# Patient Record
Sex: Female | Born: 1960 | Race: Black or African American | Hispanic: No | Marital: Single | Smoking: Never smoker
Health system: Southern US, Community
[De-identification: ages and names within clinical notes are randomized; demographics above are authoritative.]

## PROBLEM LIST (undated history)

## (undated) DIAGNOSIS — M1990 Unspecified osteoarthritis, unspecified site: Secondary | ICD-10-CM

## (undated) DIAGNOSIS — M719 Bursopathy, unspecified: Secondary | ICD-10-CM

---

## 2002-10-28 ENCOUNTER — Other Ambulatory Visit: Admission: RE | Admit: 2002-10-28 | Discharge: 2002-10-28 | Payer: Self-pay | Admitting: Family Medicine

## 2003-02-23 ENCOUNTER — Emergency Department (HOSPITAL_COMMUNITY): Admission: EM | Admit: 2003-02-23 | Discharge: 2003-02-23 | Payer: Self-pay | Admitting: Emergency Medicine

## 2007-07-18 ENCOUNTER — Emergency Department (HOSPITAL_COMMUNITY): Admission: EM | Admit: 2007-07-18 | Discharge: 2007-07-18 | Payer: Self-pay | Admitting: Emergency Medicine

## 2007-10-22 ENCOUNTER — Other Ambulatory Visit: Admission: RE | Admit: 2007-10-22 | Discharge: 2007-10-22 | Payer: Self-pay | Admitting: Family Medicine

## 2008-11-28 ENCOUNTER — Other Ambulatory Visit: Admission: RE | Admit: 2008-11-28 | Discharge: 2008-11-28 | Payer: Self-pay | Admitting: Internal Medicine

## 2015-07-30 ENCOUNTER — Encounter (HOSPITAL_BASED_OUTPATIENT_CLINIC_OR_DEPARTMENT_OTHER): Payer: Self-pay | Admitting: *Deleted

## 2015-07-30 ENCOUNTER — Emergency Department (HOSPITAL_BASED_OUTPATIENT_CLINIC_OR_DEPARTMENT_OTHER)
Admission: EM | Admit: 2015-07-30 | Discharge: 2015-07-30 | Disposition: A | Discharge status: Home / Self Care | Payer: PRIVATE HEALTH INSURANCE | Attending: Emergency Medicine | Admitting: Emergency Medicine

## 2015-07-30 DIAGNOSIS — M7551 Bursitis of right shoulder: Secondary | ICD-10-CM | POA: Insufficient documentation

## 2015-07-30 DIAGNOSIS — M79601 Pain in right arm: Secondary | ICD-10-CM | POA: Diagnosis present

## 2015-07-30 MED ORDER — PREDNISONE 20 MG PO TABS: ORAL_TABLET | ORAL | Status: DC

## 2015-07-30 MED ORDER — TRAMADOL HCL 50 MG PO TABS: 100.0000 mg | ORAL_TABLET | Freq: Four times a day (QID) | ORAL | Status: DC | PRN

## 2015-07-30 NOTE — ED Notes (Signed)
States rt arm is tender to touch, has difficulty lifting rt arm at times.

## 2015-07-30 NOTE — Discharge Instructions (Signed)
Bursitis °Bursitis is inflammation and irritation of a bursa, which is one of the small, fluid-filled sacs that cushion and protect the moving parts of your body. These sacs are located between bones and muscles, muscle attachments, or skin areas next to bones. A bursa protects these structures from the wear and tear that results from frequent movement. °An inflamed bursa causes pain and swelling. Fluid may build up inside the sac. Bursitis is most common near joints, especially the knees, elbows, hips, and shoulders. °CAUSES °Bursitis can be caused by:  °· Injury from: °¨ A direct blow, like falling on your knee or elbow. °¨ Overuse of a joint (repetitive stress). °· Infection. This can happen if bacteria gets into a bursa through a cut or scrape near a joint. °· Diseases that cause joint inflammation, such as gout and rheumatoid arthritis. °RISK FACTORS °You may be at risk for bursitis if you:  °· Have a job or hobby that involves a lot of repetitive stress on your joints. °· Have a condition that weakens your body's defense system (immune system), such as diabetes, cancer, or HIV. °· Lift and reach overhead often. °· Kneel or lean on hard surfaces often. °· Run or walk often. °SIGNS AND SYMPTOMS °The most common signs and symptoms of bursitis are: °· Pain that gets worse when you move the affected body part or put weight on it. °· Inflammation. °· Stiffness. °Other signs and symptoms may include: °· Redness. °· Tenderness. °· Warmth. °· Pain that continues after rest. °· Fever and chills. This may occur in bursitis caused by infection. °DIAGNOSIS °Bursitis may be diagnosed by:  °· Medical history and physical exam. °· MRI. °· A procedure to drain fluid from the bursa with a needle (aspiration). The fluid may be checked for signs of infection or gout. °· Blood tests to rule out other causes of inflammation. °TREATMENT  °Bursitis can usually be treated at home with rest, ice, compression, and elevation (RICE). For  mild bursitis, RICE treatment may be all you need. Other treatments may include: °· Nonsteroidal anti-inflammatory drugs (NSAIDs) to treat pain and inflammation. °· Corticosteroids to fight inflammation. You may have these drugs injected into and around the area of bursitis. °· Aspiration of bursitis fluid to relieve pain and improve movement. °· Antibiotic medicine to treat an infected bursa. °· A splint, brace, or walking aid. °· Physical therapy if you continue to have pain or limited movement. °· Surgery to remove a damaged or infected bursa. This may be needed if you have a very bad case of bursitis or if other treatments have not worked. °HOME CARE INSTRUCTIONS  °· Take medicines only as directed by your health care provider. °· If you were prescribed an antibiotic medicine, finish it all even if you start to feel better. °· Rest the affected area as directed by your health care provider. °¨ Keep the area elevated. °¨ Avoid activities that make pain worse. °· Apply ice to the injured area: °¨ Place ice in a plastic bag. °¨ Place a towel between your skin and the bag. °¨ Leave the ice on for 20 minutes, 2-3 times a day. °· Use splints, braces, pads, or walking aids as directed by your health care provider. °· Keep all follow-up visits as directed by your health care provider. This is important. °PREVENTION  °· Wear knee pads if you kneel often. °· Wear sturdy running or walking shoes that fit you well. °· Take regular breaks from repetitive activity. °· Warm   up by stretching before doing any strenuous activity. °· Maintain a healthy weight or lose weight as recommended by your health care provider. Ask your health care provider if you need help. °· Exercise regularly. Start any new physical activity gradually. °SEEK MEDICAL CARE IF:  °· Your bursitis is not responding to treatment or home care. °· You have a fever. °· You have chills. °  °This information is not intended to replace advice given to you by your  health care provider. Make sure you discuss any questions you have with your health care provider. °  °Document Released: 09/20/2000 Document Revised: 06/14/2015 Document Reviewed: 12/13/2013 °Elsevier Interactive Patient Education ©2016 [employer] ° °

## 2015-07-30 NOTE — ED Notes (Signed)
Verified HR via radial pulse, rate of 100/min, regular

## 2015-07-30 NOTE — ED Notes (Signed)
Pt presents with rt arm pain, onset this past Thursday, denies any falls or injuries to rt arm, pain is worse at night when trying to sleep

## 2015-07-30 NOTE — ED Provider Notes (Signed)
CSN: [id number]     Arrival date & time 07/30/15  8850 History   First MD Initiated Contact with Patient 07/30/15 0900     Chief Complaint  Patient presents with  . Arm Pain     (Consider location/radiation/quality/duration/timing/severity/associated sxs/prior Treatment) HPI Patient reports that she started developing right arm and shoulder pain 4 days ago. 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 She reports it continued to worsen however and now she has to use her other arm to raise her right arm up. She reports is too painful right on the front of the shoulder to even pick her arm up to the height of her shoulder. She reports she does not have numbness or tingling into the hand. She reports last night she really couldn't sleep because it was so painful. There has been no associated injury. Patient has history of rheumatoid arthritis. She states that she was on methotrexate up until 2 years ago. She has not had other associated symptoms. She otherwise feels well. History reviewed. No pertinent past medical history. History reviewed. No pertinent past surgical history. No family history on file. Social History  Substance Use Topics  . Smoking status: Never Smoker   . Smokeless tobacco: None  . Alcohol Use: No   OB History    No data available     Review of Systems  10 Systems reviewed and are negative for acute change except as noted in the HPI.   Allergies  Aspirin  Home Medications   Prior to Admission medications   Medication Sig Start Date End Date Taking? Authorizing Provider  predniSONE (DELTASONE) 20 MG tablet 3 tabs po day one, then 2 po daily x 4 days 07/30/15   Arby Barrette, MD  traMADol (ULTRAM) 50 MG tablet Take 2 tablets (100 mg total) by mouth every 6 (six) hours as needed. 07/30/15   Arby Barrette, MD   BP 125/83 mmHg  Pulse 110  Temp(Src) 98.3 F (36.8 C) (Oral)  Resp 20  Ht 5\' 2"  (1.575 m)  Wt 150  lb (68.04 kg)  BMI 27.43 kg/m2  SpO2 98% Physical Exam  Constitutional: She is oriented to person, place, and time. She appears well-developed and well-nourished. No distress.  HENT:  Head: Normocephalic and atraumatic.  Eyes: EOM are normal.  Neck: Neck supple.  No bony point tenderness of the cervical spine. No reproducible pain or muscular spasm along the trapezius.  Cardiovascular: Normal rate, regular rhythm, normal heart sounds and intact distal pulses.   Pulmonary/Chest: Effort normal and breath sounds normal. She exhibits no tenderness.  Abdominal: Soft. She exhibits no distension. There is no tenderness.  Musculoskeletal:  Reproducible point tenderness over the anterior point of the shoulder at the [facility] joint. Patient has significant pain with abduction and requires assistance for passive range of motion with abduction over the head. No visible swelling or erythema. Normal palpation of the axilla without lymph and not but the or tenderness. No edema or tenderness of soft tissues of the arm. No elbow effusion normal range of motion at the wrist and hand.  Neurological: She is alert and oriented to person, place, and time. No cranial nerve deficit. She exhibits normal muscle tone. Coordination normal.  Skin: Skin is warm and dry.  Psychiatric: She has a normal mood and affect.    ED Course  Procedures (including critical care time) Labs Review Labs Reviewed - No data to display  Imaging Review No  results found. I have personally reviewed and evaluated these images and lab results as part of my medical decision-making.   EKG Interpretation None      MDM   Final diagnoses:  Bursitis of deltoid, right   Findings consistent with bursitis or tendinitis over the shoulder. Point tenderness is consistent with acromioclavicular bursitis. She does not have compressive tenderness to the biceps or biceps weakness. Patient reports history consistent with rheumatoid arthritis with  history of methotrexate treatment. At this time her joint is not red or hot. Not have fever or other constitutional symptoms. Patient will be treated as on uncomplicated bursitis with recommended follow-up with her primary care doctor this week.    Arby Barrette, MD 07/30/15 [phone]

## 2015-07-30 NOTE — ED Notes (Signed)
In to see pt, pt in restroom

## 2015-07-30 NOTE — ED Notes (Signed)
Presents with rt arm pain.

## 2015-07-30 NOTE — ED Notes (Signed)
Rx's x 2 as written by EDP reviewed with pt, pt teaching done re: (1) importance of following instructions while taking Prednisone and (2) safety while taking pain medication, (3) when to follow up with MD or ED, opportunity for questions provided

## 2015-12-04 DIAGNOSIS — M8588 Other specified disorders of bone density and structure, other site: Secondary | ICD-10-CM | POA: Insufficient documentation

## 2015-12-04 DIAGNOSIS — G43909 Migraine, unspecified, not intractable, without status migrainosus: Secondary | ICD-10-CM | POA: Insufficient documentation

## 2015-12-04 DIAGNOSIS — M1990 Unspecified osteoarthritis, unspecified site: Secondary | ICD-10-CM | POA: Insufficient documentation

## 2016-01-05 DIAGNOSIS — E559 Vitamin D deficiency, unspecified: Secondary | ICD-10-CM | POA: Insufficient documentation

## 2016-01-05 DIAGNOSIS — M722 Plantar fascial fibromatosis: Secondary | ICD-10-CM | POA: Insufficient documentation

## 2016-01-05 DIAGNOSIS — J309 Allergic rhinitis, unspecified: Secondary | ICD-10-CM | POA: Insufficient documentation

## 2016-01-05 DIAGNOSIS — Z79899 Other long term (current) drug therapy: Secondary | ICD-10-CM | POA: Insufficient documentation

## 2016-03-02 ENCOUNTER — Emergency Department (HOSPITAL_BASED_OUTPATIENT_CLINIC_OR_DEPARTMENT_OTHER)
Admission: EM | Admit: 2016-03-02 | Discharge: 2016-03-02 | Disposition: A | Discharge status: Home / Self Care | Payer: PRIVATE HEALTH INSURANCE | Attending: Emergency Medicine | Admitting: Emergency Medicine

## 2016-03-02 ENCOUNTER — Encounter (HOSPITAL_BASED_OUTPATIENT_CLINIC_OR_DEPARTMENT_OTHER): Payer: Self-pay | Admitting: Emergency Medicine

## 2016-03-02 DIAGNOSIS — R05 Cough: Secondary | ICD-10-CM | POA: Diagnosis not present

## 2016-03-02 DIAGNOSIS — K1379 Other lesions of oral mucosa: Secondary | ICD-10-CM | POA: Diagnosis not present

## 2016-03-02 DIAGNOSIS — M25512 Pain in left shoulder: Secondary | ICD-10-CM | POA: Insufficient documentation

## 2016-03-02 HISTORY — DX: Bursopathy, unspecified: M71.9

## 2016-03-02 HISTORY — DX: Unspecified osteoarthritis, unspecified site: M19.90

## 2016-03-02 MED ORDER — PREDNISONE 20 MG PO TABS: ORAL_TABLET | ORAL | Status: DC

## 2016-03-02 NOTE — ED Provider Notes (Signed)
CSN: [id number]     Arrival date & time 03/02/16  1855 History   First MD Initiated Contact with Patient 03/02/16 1921     Chief Complaint  Patient presents with  . Oral Swelling     (Consider location/radiation/quality/duration/timing/severity/associated sxs/prior Treatment) HPI Comments: Patient with history of left shoulder bursitis presents with complaint of mouth pain starting this morning. Patient states that she has a sore throat and pain is worse with swallowing. She does not have any difficulty breathing. She denies dental pain, ear pain, fever. She states that her face feels swollen. No pain with movement of her neck. No treatments prior to arrival. She is able to eat without difficulty.   Patient states that her left shoulder pain is similar to her previous bursitis. She states that she was going to see her doctor on Tuesday and that oral steroids have provided her some relief in the past.  The history is provided by the patient.    Past Medical History  Diagnosis Date  . Arthritis   . Bursitis    History reviewed. No pertinent past surgical history. History reviewed. No pertinent family history. Social History  Substance Use Topics  . Smoking status: Never Smoker   . Smokeless tobacco: None  . Alcohol Use: No   OB History    No data available     Review of Systems  Constitutional: Negative for fever.  HENT: Positive for facial swelling and sore throat. Negative for dental problem, ear pain and trouble swallowing.   Respiratory: Positive for cough. Negative for shortness of breath and stridor.   Musculoskeletal: Positive for myalgias and arthralgias. Negative for neck pain.  Skin: Negative for color change.  Neurological: Negative for headaches.      Allergies  Aspirin  Home Medications   Prior to Admission medications   Medication Sig Start Date End Date Taking? Authorizing Provider  predniSONE (DELTASONE) 20 MG tablet 3 tabs po day one, then 2 po daily  x 4 days 07/30/15   Arby Barrette, MD  traMADol (ULTRAM) 50 MG tablet Take 2 tablets (100 mg total) by mouth every 6 (six) hours as needed. 07/30/15   Arby Barrette, MD   BP 118/89 mmHg  Pulse 87  Temp(Src) 98.7 F (37.1 C) (Oral)  Resp 18  Ht 5\' 2"  (1.575 m)  Wt 68.493 kg  BMI 27.61 kg/m2  SpO2 100% Physical Exam  Constitutional: She appears well-developed and well-nourished.  HENT:  Head: Normocephalic and atraumatic.  Right Ear: Tympanic membrane, external ear and ear canal normal.  Left Ear: Tympanic membrane, external ear and ear canal normal.  Nose: Nose normal.  Mouth/Throat: Uvula is midline, oropharynx is clear and moist and mucous membranes are normal. No trismus in the jaw. Abnormal dentition. Dental caries present. No dental abscesses or uvula swelling. No tonsillar abscesses.  No swelling or erythema noted on exam. Small tender nodule noted left sub-mental area.   Eyes: Conjunctivae are normal. Right eye exhibits no discharge. Left eye exhibits no discharge.  Neck: Normal range of motion. Neck supple.  No neck swelling or Ludwig's angina  Cardiovascular: Normal rate, regular rhythm and normal heart sounds.   Pulmonary/Chest: Effort normal and breath sounds normal.  Abdominal: Soft. There is no tenderness.  Musculoskeletal:       Left shoulder: She exhibits tenderness. She exhibits normal range of motion, no bony tenderness and no spasm.       Cervical back: Normal.       Left upper  arm: Normal.  Lymphadenopathy:    She has no cervical adenopathy.  Neurological: She is alert.  Skin: Skin is warm and dry.  Psychiatric: She has a normal mood and affect.  Nursing note and vitals reviewed.   ED Course  Procedures (including critical care time)   7:51 PM Patient seen and examined.   Vital signs reviewed and are as follows: BP 118/89 mmHg  Pulse 87  Temp(Src) 98.7 F (37.1 C) (Oral)  Resp 18  Ht 5\' 2"  (1.575 m)  Wt 68.493 kg  BMI 27.61 kg/m2  SpO2  100%  Plan: Anti-inflammatories, close monitoring for worsening swelling of her jaw or neck. Prescription given for prednisone.  MDM   Final diagnoses:  Mouth pain  Shoulder pain, acute, left   Mouth pain: Patient does have a small submental, likely lymph node, cannot entirely rule out swollen salivary gland. Throat exam is normal. No concerns for a deep space neck infection. Patient is able to breathe and swallow normally. No obvious dental abscess. No trismus. No indication for further imaging at this point.  Shoulder pain: Chronic, prednisone prescribed. Patient to follow-up with her doctor next week for further evaluation and management. Left upper extremity is neurovascularly intact.    , PA-C 03/02/16 2015  03/04/16, MD 03/02/16 2131

## 2016-03-02 NOTE — ED Notes (Signed)
Patient states that she has "normal" left shoulder pain and woke up this am with that pain. States that after she took a nap ealier today she has pain to her left throat and jaw.

## 2016-03-02 NOTE — ED Notes (Addendum)
Per pt report she has chronic lt shoulder pain an is planning on seeing her PCP for a steroid shot. She took a nap then woke to lt jaw pain and sore throat and tenderness at jaw. Pt reports that she feels like her lt face is swollen. Has not taken any medication for pain. No fever or chills , no difficulty eating.

## 2016-03-02 NOTE — Discharge Instructions (Signed)
Please read and follow all provided instructions.  Your diagnoses today include:  1. Mouth pain   2. Shoulder pain, acute, left    The exam and treatment you received today has been provided on an emergency basis only. This is not a substitute for complete medical or dental care.  Tests performed today include:  Vital signs. See below for your results today.   Medications prescribed:   Prednisone - steroid medicine   It is best to take this medication in the morning to prevent sleeping problems. If you are diabetic, monitor your blood sugar closely and stop taking Prednisone if blood sugar is over 300. Take with food to prevent stomach upset.   Take any prescribed medications only as directed.  Home care instructions:  Follow any educational materials contained in this packet.  Follow-up instructions: Please follow-up with your doctor for further evaluation of your symptoms.   Return instructions:   Please return to the Emergency Department if you experience worsening symptoms.  Please return if you develop a fever, you develop more swelling in your face or neck, you have trouble breathing or swallowing food.  Please return if you have any other emergent concerns.  Additional Information:  Your vital signs today were: BP 118/89 mmHg   Pulse 87   Temp(Src) 98.7 F (37.1 C) (Oral)   Resp 18   Ht 5\' 2"  (1.575 m)   Wt 68.493 kg   BMI 27.61 kg/m2   SpO2 100% If your blood pressure (BP) was elevated above 135/85 this visit, please have this repeated by your doctor within one month. --------------

## 2016-07-18 DIAGNOSIS — Z0000 Encounter for general adult medical examination without abnormal findings: Secondary | ICD-10-CM | POA: Insufficient documentation

## 2017-01-17 ENCOUNTER — Emergency Department (HOSPITAL_BASED_OUTPATIENT_CLINIC_OR_DEPARTMENT_OTHER): Payer: PRIVATE HEALTH INSURANCE

## 2017-01-17 ENCOUNTER — Encounter (HOSPITAL_BASED_OUTPATIENT_CLINIC_OR_DEPARTMENT_OTHER): Payer: Self-pay | Admitting: *Deleted

## 2017-01-17 ENCOUNTER — Emergency Department (HOSPITAL_BASED_OUTPATIENT_CLINIC_OR_DEPARTMENT_OTHER)
Admission: EM | Admit: 2017-01-17 | Discharge: 2017-01-17 | Disposition: A | Discharge status: Home / Self Care | Payer: PRIVATE HEALTH INSURANCE | Attending: Emergency Medicine | Admitting: Emergency Medicine

## 2017-01-17 DIAGNOSIS — M25552 Pain in left hip: Secondary | ICD-10-CM | POA: Insufficient documentation

## 2017-01-17 LAB — D-DIMER, QUANTITATIVE (NOT AT ARMC): D DIMER QUANT: 0.62 ug{FEU}/mL — AB (ref 0.00–0.50)

## 2017-01-17 MED ORDER — OXYCODONE-ACETAMINOPHEN 5-325 MG PO TABS: 1.0000 | ORAL_TABLET | ORAL | 0 refills | Status: DC | PRN

## 2017-01-17 MED ORDER — HYDROCODONE-ACETAMINOPHEN 5-325 MG PO TABS
1.0000 | ORAL_TABLET | Freq: Once | ORAL | Status: AC
  Administered 2017-01-17: 1 via ORAL
  Filled 2017-01-17: qty 1

## 2017-01-17 MED ORDER — ONDANSETRON 8 MG PO TBDP: 8.0000 mg | ORAL_TABLET | Freq: Three times a day (TID) | ORAL | 1 refills | Status: DC | PRN

## 2017-01-17 MED ORDER — NAPROXEN 250 MG PO TABS
500.0000 mg | ORAL_TABLET | Freq: Once | ORAL | Status: AC
  Administered 2017-01-17: 500 mg via ORAL
  Filled 2017-01-17: qty 2

## 2017-01-17 NOTE — ED Provider Notes (Addendum)
[facility] Provider Note: Debbie Dell, MD, FACEP  CSN: [id number] MRN: [id number] ARRIVAL: 01/17/17 at 0305 ROOM: [facility]   CHIEF COMPLAINT  Leg Pain   HISTORY OF PRESENT ILLNESS  Debbie Clayton is a 56 y.o. female with a history of rheumatoid arthritis who has had the gradual onset of pain in her mid left hip since yesterday. She feels the pain most severely in the midanterior groin fold. The pain has steadily worsened and is now severe. It is so severe she is unable to lift her left leg at the hip. She is not aware of any injury. She states it feels as if it is swollen but does not see or feel any swelling.  Consultation with the [facility] state controlled substances database reveals the patient has received a single prescription for codeine cough syrup and no other opioids in the past year.    Past Medical History:  Diagnosis Date  . Arthritis   . Bursitis     History reviewed. No pertinent surgical history.  History reviewed. No pertinent family history.  Social History  Substance Use Topics  . Smoking status: Never Smoker  . Smokeless tobacco: Never Used  . Alcohol use No    Prior to Admission medications   Medication Sig Start Date End Date Taking? Authorizing Provider  methotrexate (RHEUMATREX) 10 MG tablet Take 10 mg by mouth once a week. Caution: Chemotherapy. Protect from light.   Yes Historical Provider, MD  SUMAtriptan (IMITREX) 100 MG tablet Take 100 mg by mouth every 2 (two) hours as needed for migraine. May repeat in 2 hours if headache persists or recurs.   Yes Historical Provider, MD  oxyCODONE-acetaminophen (PERCOCET) 5-325 MG tablet Take 1 tablet by mouth every 4 (four) hours as needed (for pain). 01/17/17   Paula Libra, MD    Allergies Aspirin   REVIEW OF SYSTEMS  Negative except as noted here or in the History of Present Illness.   PHYSICAL EXAMINATION  Initial Vital Signs Blood pressure (!) 108/59, pulse 97,  temperature 97.9 F (36.6 C), resp. rate 16, height 5\' 5"  (1.651 m), weight 145 lb (65.8 kg), SpO2 100 %.  Examination General: Well-developed, well-nourished female in no acute distress; appearance consistent with age of record HENT: normocephalic; atraumatic Eyes: pupils equal, round and reactive to light; extraocular muscles intact Neck: supple Heart: regular rate and rhythm Lungs: clear to auscultation bilaterally Abdomen: soft; nondistended; nontender; bowel sounds present Extremities: No deformity; full range of motion except left hip; pulses normal; tenderness of the mid left anterior groin fold without palpable tendon defect, mass or swelling; very limited ability to anteroflex at the left hip; retroflexion of the left hip intact but limited due to pain; flexion and extension of the left knee intact Neurologic: Awake, alert and oriented; motor function intact in all extremities and symmetric; no facial droop Skin: Warm and dry Psychiatric: Normal mood and affect   RESULTS  Summary of this visit's results, reviewed by myself:   EKG Interpretation  Date/Time:    Ventricular Rate:    PR Interval:    QRS Duration:   QT Interval:    QTC Calculation:   R Axis:     Text Interpretation:        Laboratory Studies: Results for orders placed or performed during the hospital encounter of 01/17/17 (from the past 24 hour(s))  D-dimer, quantitative (not at [facility])     Status: Abnormal   Collection Time: 01/17/17  4:24 AM  Result Value Ref Range   D-Dimer, Quant 0.62 (H) 0.00 - 0.50 ug/mL-FEU   Imaging Studies: Ct Hip Left Wo Contrast  Result Date: 01/17/2017 CLINICAL DATA:  Acute onset of left inguinal and left hip pain. Unable to bear weight. Initial encounter. EXAM: CT OF THE LEFT HIP WITHOUT CONTRAST TECHNIQUE: Multidetector CT imaging of the left hip was performed according to the standard protocol. Multiplanar CT image reconstructions were also generated. COMPARISON:  Left hip  radiographs performed earlier today at 4:02 a.m. FINDINGS: Bones/Joint/Cartilage There is suspicion of a minimally displaced fracture extending across the medial aspect of the left femoral head; it is not well characterized laterally. The left femoral head remains seated at the acetabulum. Visualized joint spaces are preserved. A trace left hip joint effusion is noted. The cartilage is not well assessed on CT. Ligaments Suboptimally assessed by CT. Muscles and Tendons The visualized musculature is grossly unremarkable in appearance. No tendon abnormalities are characterized. Soft tissues The visualized portions of the bladder are grossly unremarkable. The visualized small large bowel are within normal limits. IMPRESSION: 1. Suspect minimally displaced fracture extending across the medial aspect of the left femoral head; it is not well characterized laterally. 2. Trace left hip joint effusion noted. Electronically Signed   By: Roanna Raider M.D.   On: 01/17/2017 05:57   Dg Hip Unilat W Or Wo Pelvis 2-3 Views Left  Result Date: 01/17/2017 CLINICAL DATA:  Acute onset of left inguinal pain. Unable to bear weight. Initial encounter. EXAM: DG HIP (WITH OR WITHOUT PELVIS) 2-3V LEFT COMPARISON:  None. FINDINGS: There is suggestion of a minimally displaced subcapital fracture of the left femoral neck, though this is difficult to fully characterize. Both femoral heads are seated normally within their respective acetabula. No significant degenerative change is appreciated. The sacroiliac joints are unremarkable in appearance. The visualized bowel gas pattern is grossly unremarkable in appearance. Scattered phleboliths are noted within the pelvis. IMPRESSION: Suggestion of minimally displaced subcapital fracture of the left femoral neck, difficult to fully characterize and seen only on a single view. This could be further assessed on MRI or CT, as deemed clinically appropriate. Electronically Signed   By: Roanna Raider  M.D.   On: 01/17/2017 04:29    ED COURSE  Nursing notes and initial vitals signs, including pulse oximetry, reviewed.  Vitals:   01/17/17 0319 01/17/17 0321  BP: (!) 108/59   Pulse: 97   Resp: 16   Temp: 97.9 F (36.6 C)   SpO2: 100%   Weight:  145 lb (65.8 kg)  Height:  5\' 5"  (1.651 m)   6:27 AM Discussed with Dr. of [facility]. He reviewed the patient's films. He is not convinced that this represents a true fracture at this point but believes we should treat it as such until proven otherwise. He advises that the location of this fracture, if present, would necessitate a hip replacement and not simple ORIF. He advises we provide the patient with crutches for necessary ambulation but she should otherwise stay at rest. He wishes to give the acute pain and inflammation about a week to subside and plans to obtain a follow-up MRI in a week if symptoms persist.  She has an appointment with her rheumatologist in 4 days area  PROCEDURES    ED DIAGNOSES     ICD-9-CM ICD-10-CM   1. Pain in left hip 719.45 M25.552        [facility], MD 01/17/17 [phone]    [id number],  MD 01/17/17 [phone]

## 2017-01-17 NOTE — ED Triage Notes (Signed)
Pt began having left upper leg/ groin pain x 13 hours ago progressively worsening to the point she is unable to bear weight.

## 2017-01-17 NOTE — Discharge Instructions (Signed)
Your x-rays suggest, but do not definitively prove, that you have a fracture (broken bone) in your left hip. You need to avoid bearing weight on your left leg and we will provide crutches in the meantime. You should try to rest as much as possible for the next week. Contact [facility] today to arrange an appointment; Dr. Aundria Rud would like to see you in about a week for reevaluation of your hip and possible MRI.

## 2018-04-22 IMAGING — CT CT HIP*L* W/O CM
2 of 4 series · 15 of 46 positions shown, 18 images · non-contrast
Comparison: Left hip radiographs performed earlier today at [DATE]
a.m.

CLINICAL DATA: Acute onset of left inguinal and left hip pain.
Unable to bear weight. Initial encounter.

EXAM:
CT OF THE LEFT HIP WITHOUT CONTRAST
TECHNIQUE: Multidetector CT imaging of the left hip was performed according to
the standard protocol. Multiplanar CT image reconstructions were
also generated.

[Series 7: axial soft tissue · axial · 0.32mm/px · z∈[+691,+841]mm · 12 of 84 slices shown, 15 images]
[im 6/84  soft-tissue]
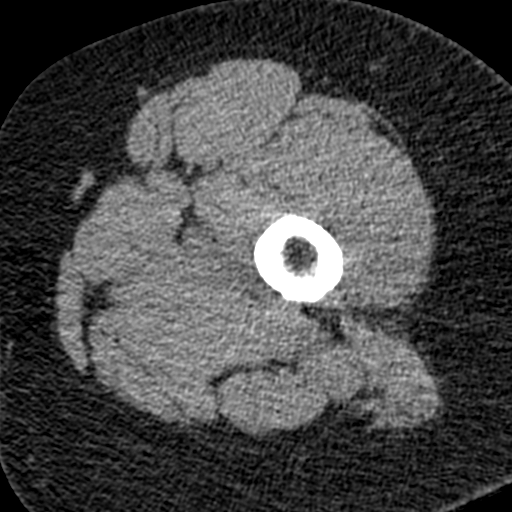
[im 6/84  bone]
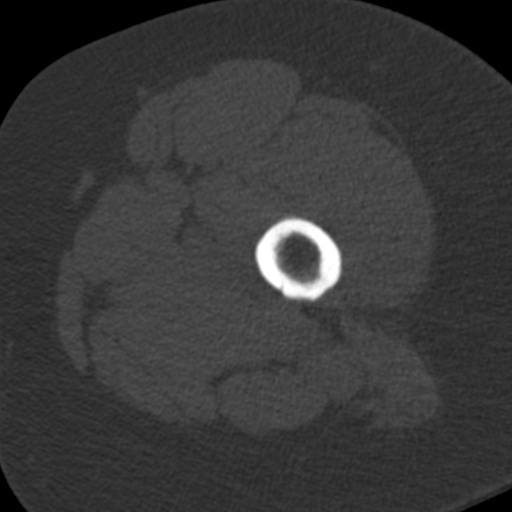
[im 17/84  soft-tissue]
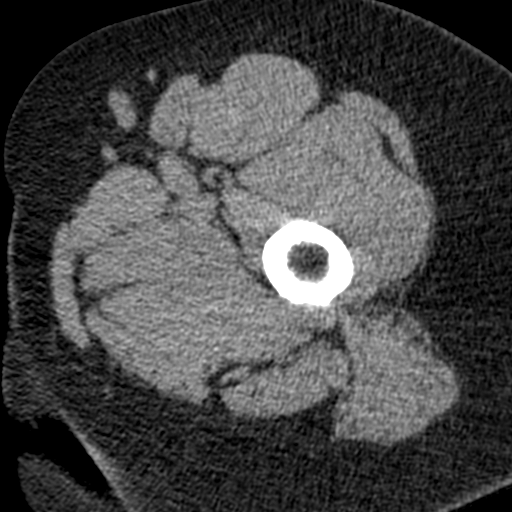
[im 25/84  soft-tissue]
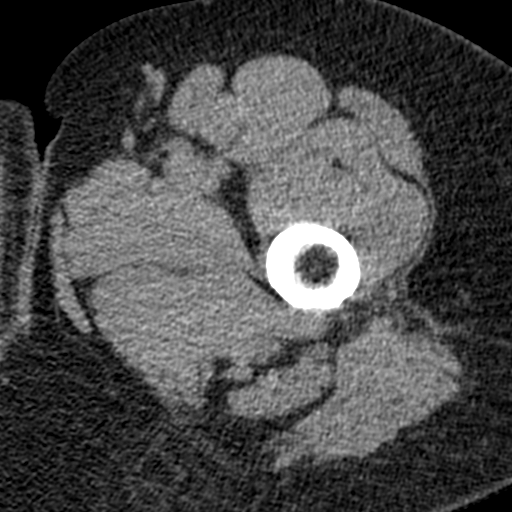
[im 33/84  soft-tissue]
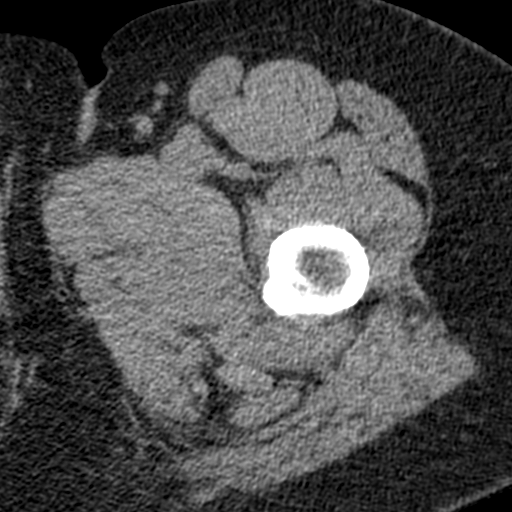
[im 43/84  soft-tissue]
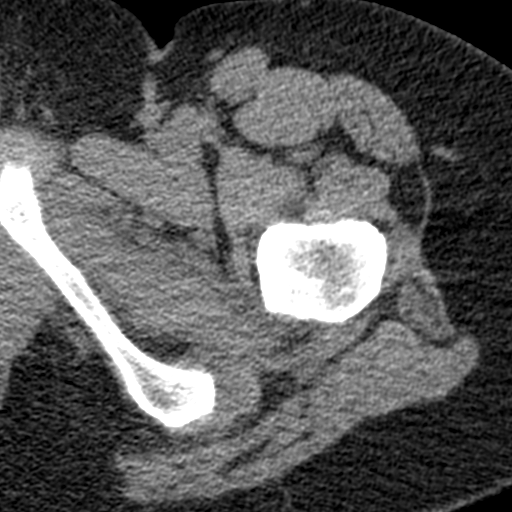
[im 51/84  soft-tissue]
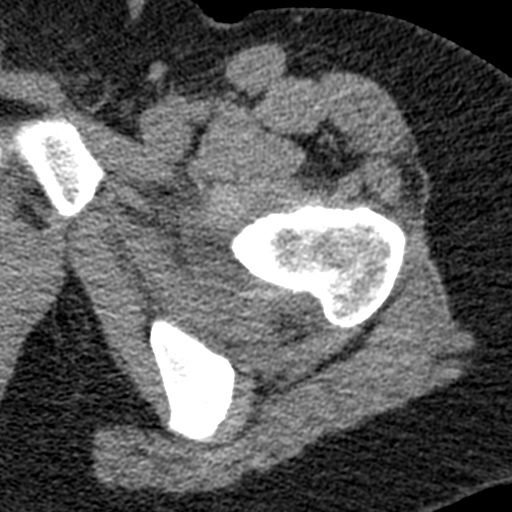
[im 59/84  soft-tissue]
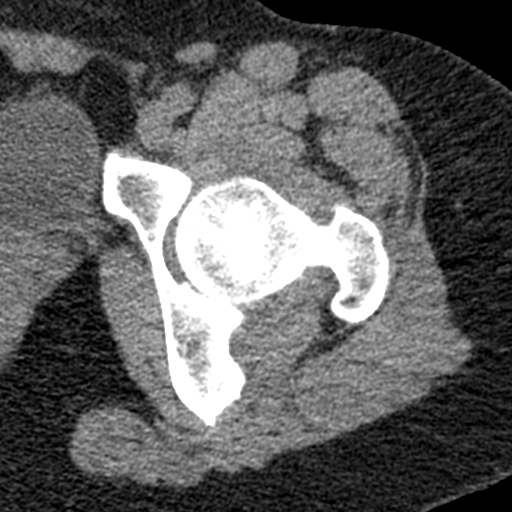
[im 70/84  soft-tissue]
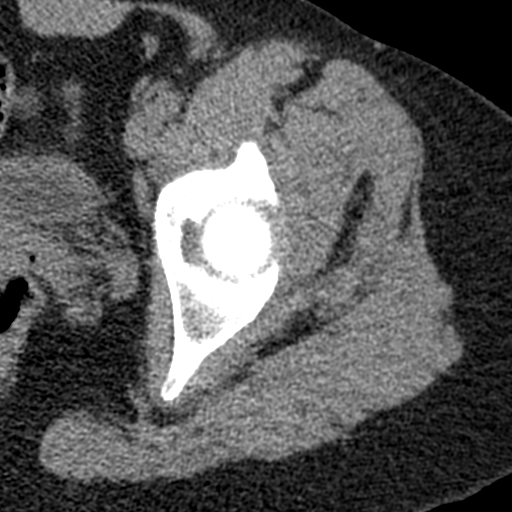
[im 73/84  lung]
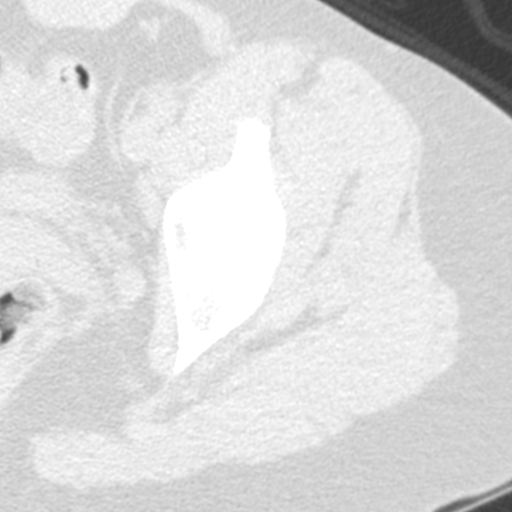
[im 75/84  lung]
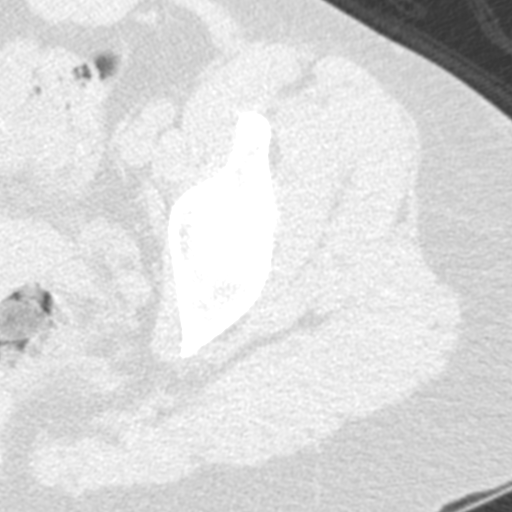
[im 78/84  soft-tissue]
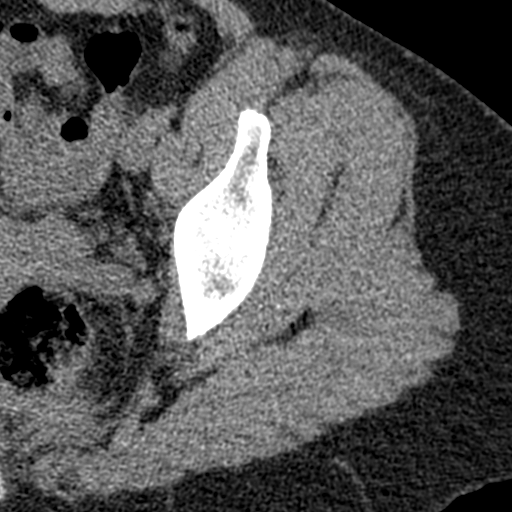
[im 78/84  lung]
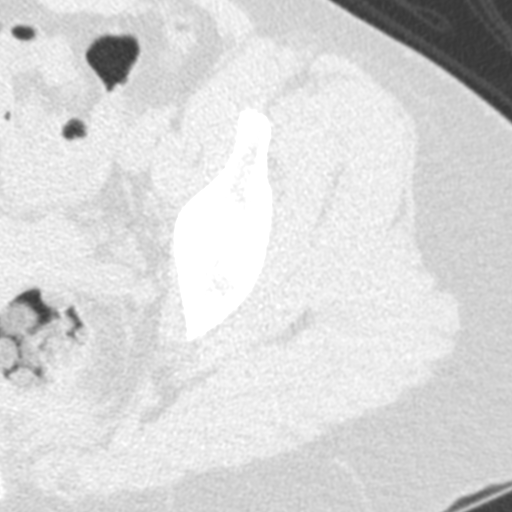
[im 78/84  bone]
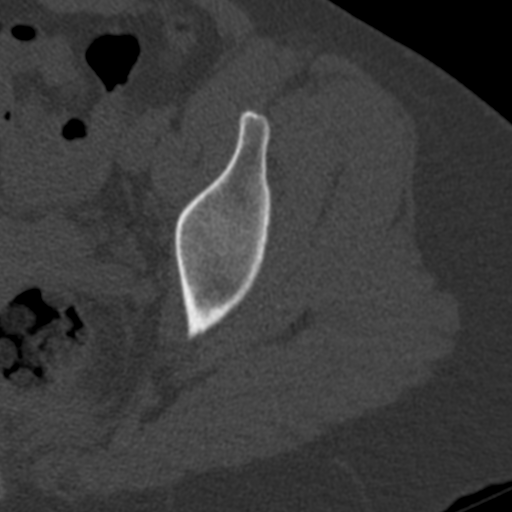
[im 81/84  lung]
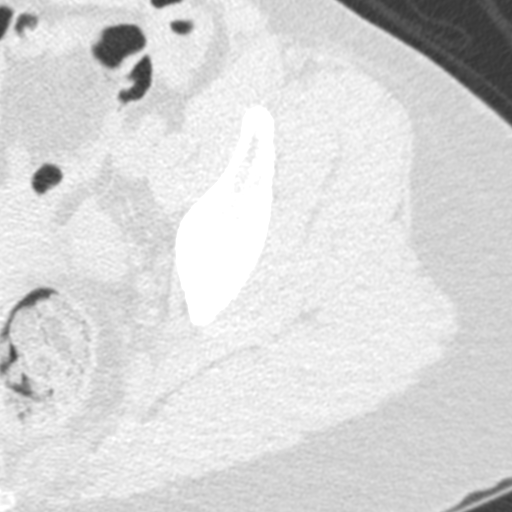

[Series 9: coronal st · coronal · 0.36mm/px · 3 of 93 slices shown]
[im 31/93  soft-tissue]
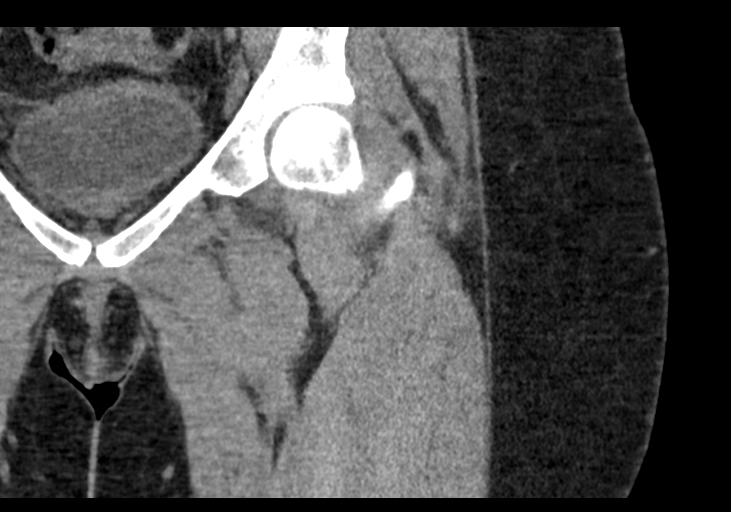
[im 41/93  soft-tissue]
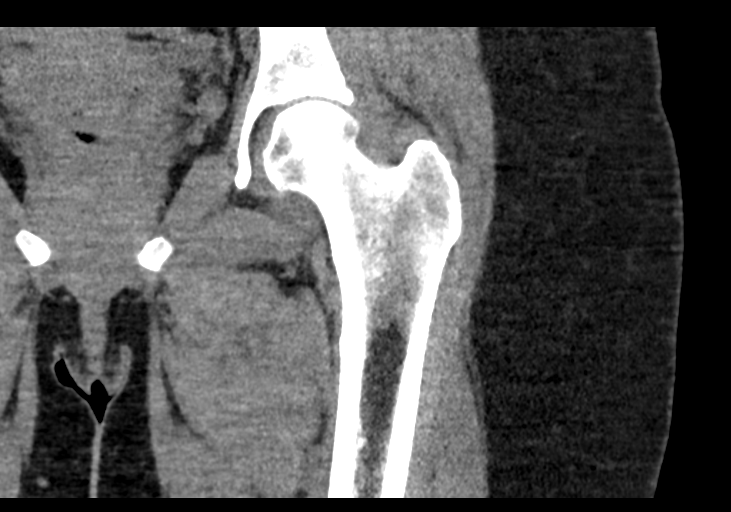
[im 52/93  soft-tissue]
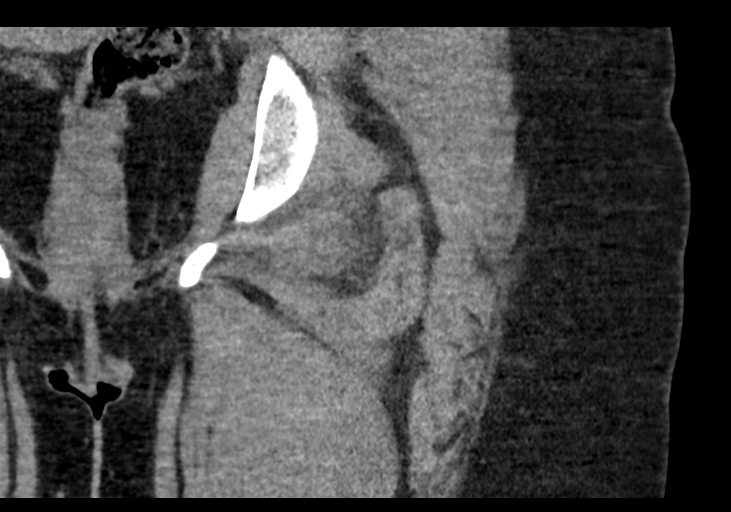

[15 of 46 positions shown; findings below may reference images not displayed]

FINDINGS: Bones/Joint/Cartilage

There is suspicion of a minimally displaced fracture extending
across the medial aspect of the left femoral head; it is not well
characterized laterally. The left femoral head remains seated at the
acetabulum. Visualized joint spaces are preserved. A trace left hip
joint effusion is noted.

The cartilage is not well assessed on CT.

Ligaments

Suboptimally assessed by CT.

Muscles and Tendons

The visualized musculature is grossly unremarkable in appearance. No
tendon abnormalities are characterized.

Soft tissues

The visualized portions of the bladder are grossly unremarkable. The
visualized small large bowel are within normal limits.
IMPRESSION: 1. Suspect minimally displaced fracture extending across the medial
aspect of the left femoral head; it is not well characterized
laterally.
2. Trace left hip joint effusion noted.

## 2018-04-22 IMAGING — DX DG HIP (WITH OR WITHOUT PELVIS) 2-3V*L*
3 series · 3 of 3 positions shown · non-contrast
Comparison: None.

CLINICAL DATA: Acute onset of left inguinal pain. Unable to bear
weight. Initial encounter.

EXAM:
DG HIP (WITH OR WITHOUT PELVIS) 2-3V LEFT

[pelvis ap]
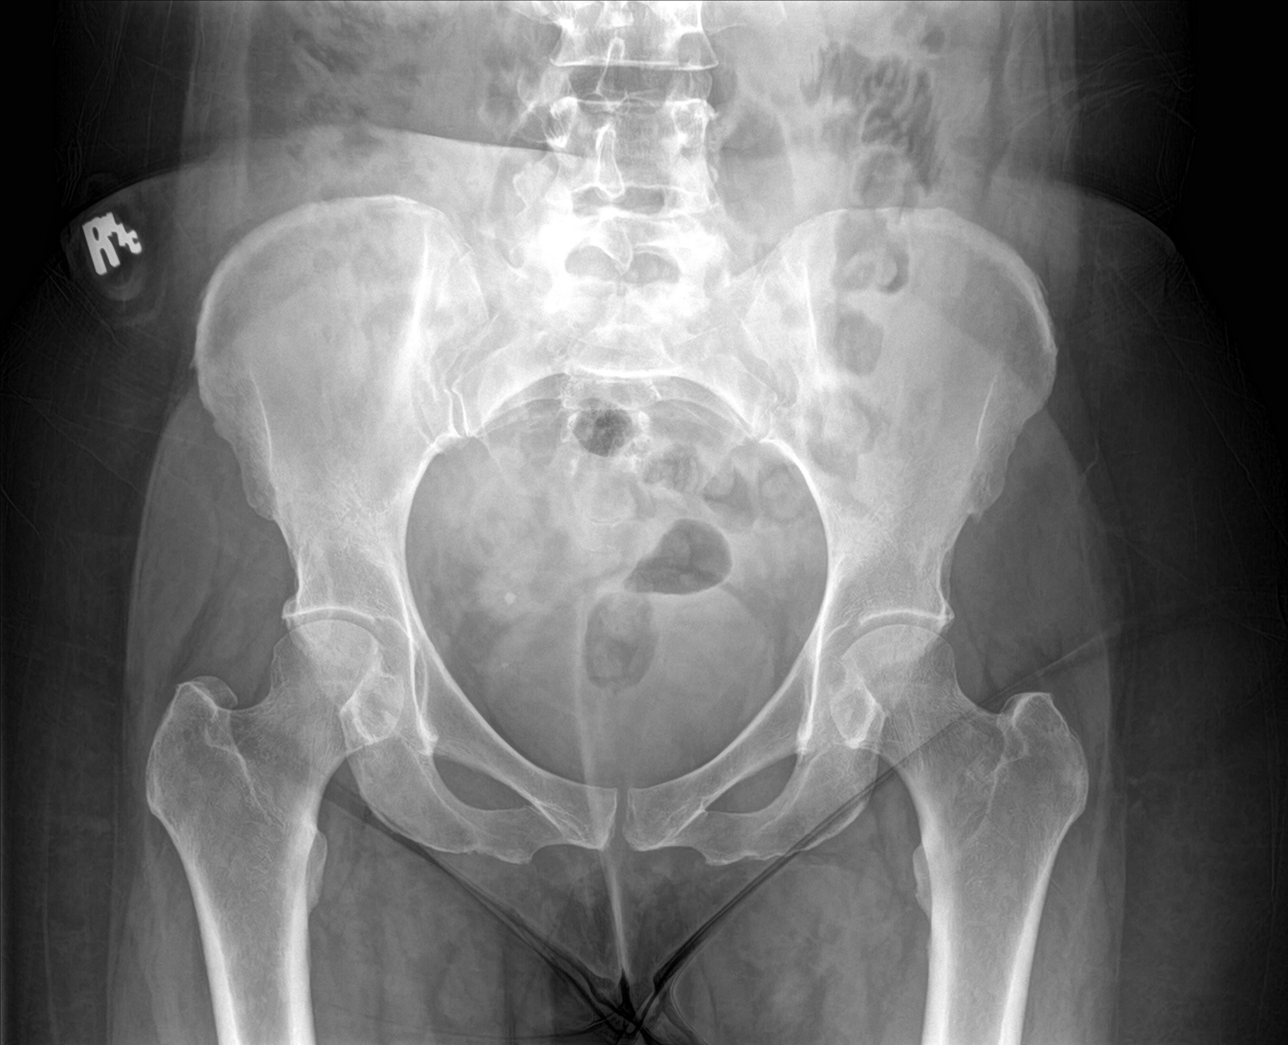

[hip ap]
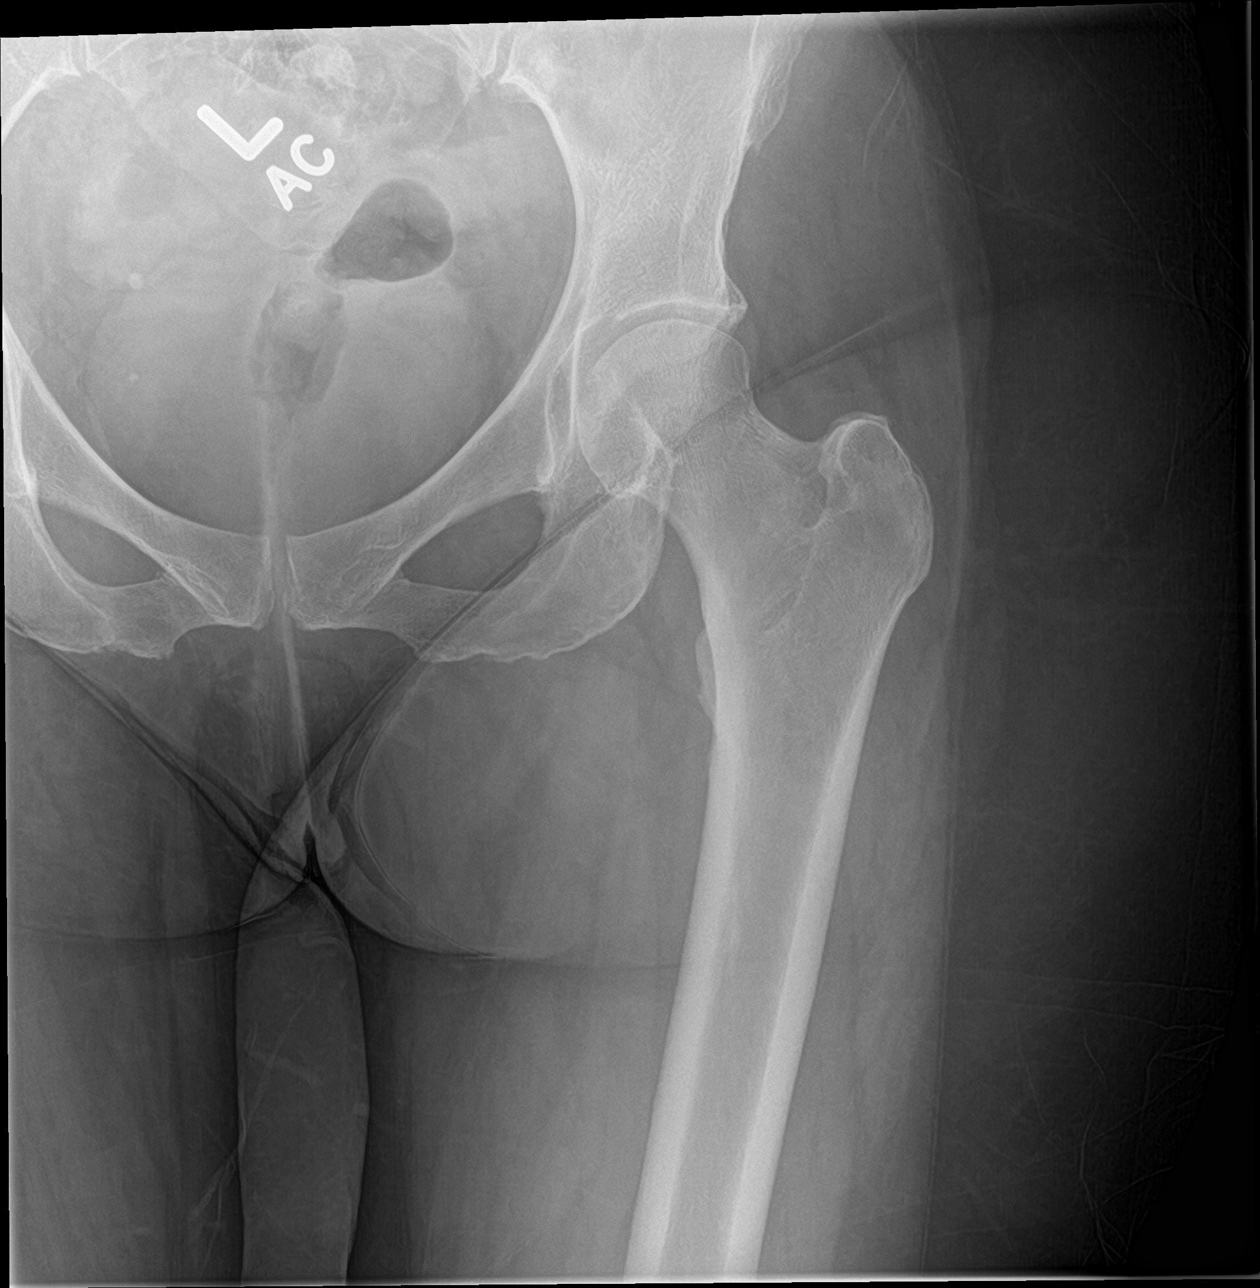

[hip lat]
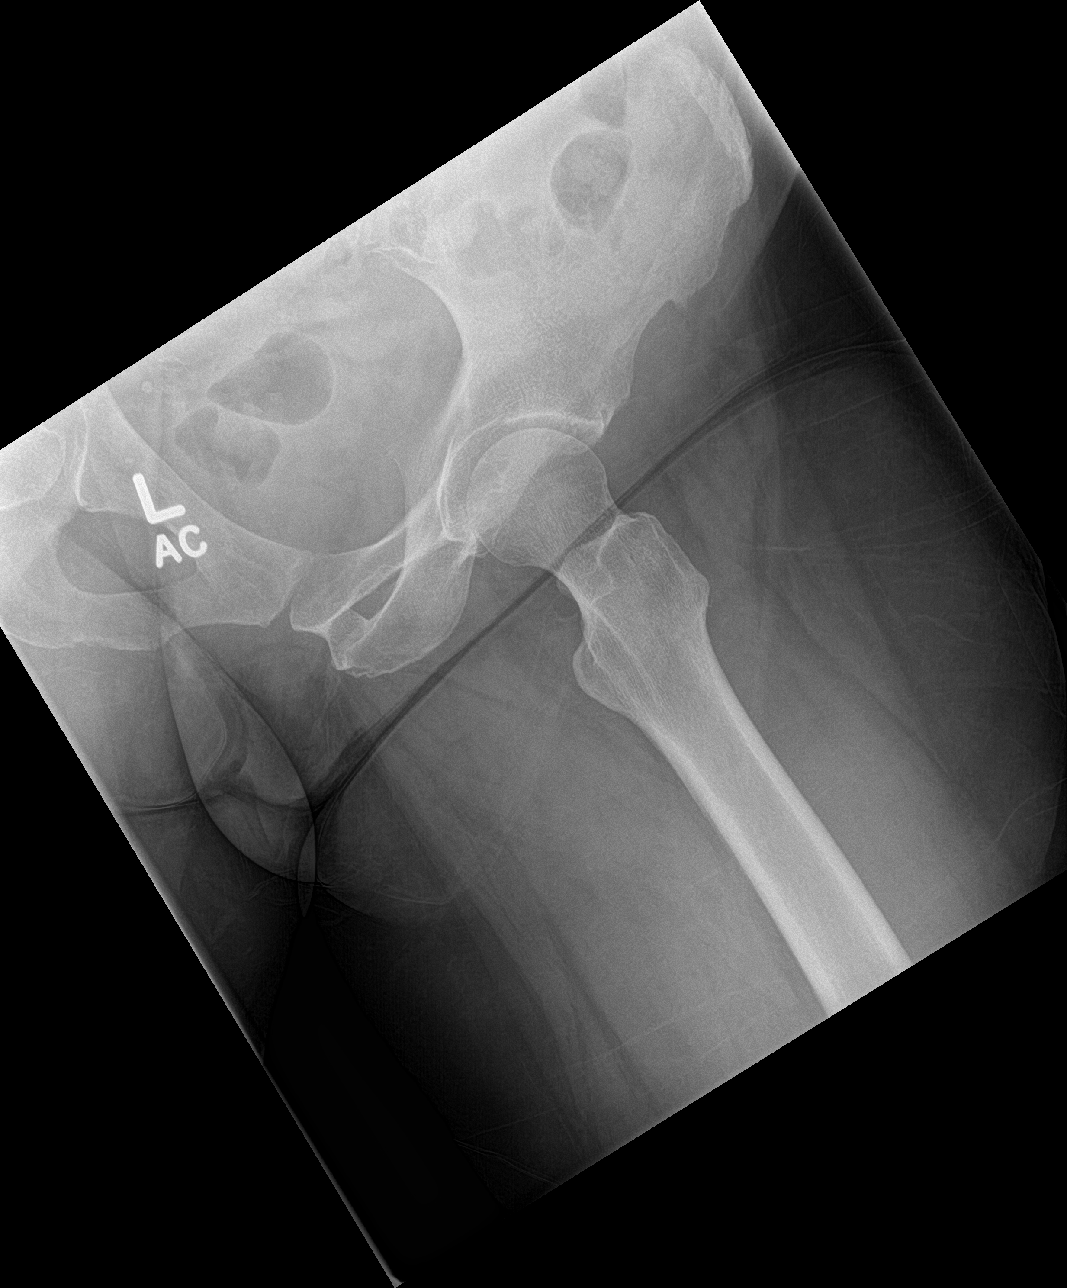

[3 of 3 positions shown; findings below may reference images not displayed]

FINDINGS: There is suggestion of a minimally displaced subcapital fracture of
the left femoral neck, though this is difficult to fully
characterize.

Both femoral heads are seated normally within their respective
acetabula. No significant degenerative change is appreciated. The
sacroiliac joints are unremarkable in appearance.

The visualized bowel gas pattern is grossly unremarkable in
appearance. Scattered phleboliths are noted within the pelvis.
IMPRESSION: Suggestion of minimally displaced subcapital fracture of the left
femoral neck, difficult to fully characterize and seen only on a
single view. This could be further assessed on MRI or CT, as deemed
clinically appropriate.

## 2019-11-09 ENCOUNTER — Ambulatory Visit: Payer: PRIVATE HEALTH INSURANCE | Admitting: Family Medicine

## 2019-11-16 ENCOUNTER — Ambulatory Visit: Payer: PRIVATE HEALTH INSURANCE | Admitting: Family Medicine

## 2019-11-16 ENCOUNTER — Encounter: Payer: Self-pay | Admitting: Family Medicine

## 2019-11-16 VITALS — BP 108/60 | HR 98 | Ht 65.0 in | Wt 133.0 lb

## 2019-11-16 DIAGNOSIS — Z789 Other specified health status: Secondary | ICD-10-CM

## 2019-11-16 NOTE — Progress Notes (Signed)
Subjective:     Patient ID: Debbie Clayton, female   DOB: April 07, 1961, 59 y.o.   MRN: [id number]  HPI Debbie [facility] for her required wellness visit for her insurance. Her PCP is Dr. Dimple Casey, who just retired, she has an appt with her new PCP scheduled for July of this year for her annual physical. Her mammogram, colonoscopy and pap smear are all UTD per pt. 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 She denies any current problems or concerns.  Past Medical History:  Diagnosis Date  . Arthritis   . Bursitis    Allergies  Allergen Reactions  . Aspirin Hives  . Tramadol Nausea And Vomiting  . Hydroxychloroquine Rash    Current Outpatient Medications:  .  Cholecalciferol 50 MCG (2000 UT) CAPS, TAKE 1 CAPSULE BY MOUTH EVERY DAY, Disp: , Rfl:  .  folic acid (FOLVITE) 1 MG tablet, TAKE 1 TABLET BY MOUTH EVERY DAY, Disp: , Rfl:  .  Multiple Vitamin (DAILY-VITE) TABS, TAKE 1 TABLET BY MOUTH EVERY DAY, Disp: , Rfl:  .  methotrexate 50 MG/2ML injection, SMARTSIG:0.6 Milliliter(s) Injection Once a Week, Disp: , Rfl:  .  SUMAtriptan (IMITREX) 100 MG tablet, Take 100 mg by mouth every 2 (two) hours as needed for migraine. May repeat in 2 hours if headache persists or recurs., Disp: , Rfl:      Review of Systems  Constitutional: Negative for chills, fatigue, fever and unexpected weight change.  HENT: Negative for congestion, ear pain, sinus pressure, sinus pain and sore throat.   Eyes: Negative for discharge and visual disturbance.  Respiratory: Negative for cough, shortness of breath and wheezing.   Cardiovascular: Negative for chest pain and leg swelling.  Gastrointestinal: Negative for abdominal pain, blood in stool, constipation, diarrhea, nausea and vomiting.  Genitourinary: Negative for difficulty urinating and  hematuria.  Skin: Negative for color change.  Neurological: Negative for dizziness, weakness, light-headedness and headaches.  Hematological: Negative for adenopathy.  All other systems reviewed and are negative.      Objective:   Physical Exam Vitals and nursing note reviewed.  Constitutional:      General: She is not in acute distress.    Appearance: She is well-developed and normal weight. She is not toxic-appearing.  HENT:     Head: Normocephalic and atraumatic.  Cardiovascular:     Rate and Rhythm: Normal rate and regular rhythm.     Heart sounds: Normal heart sounds. No murmur.  Pulmonary:     Effort: Pulmonary effort is normal. No respiratory distress.     Breath sounds: Normal breath sounds.  Musculoskeletal:     Cervical back: Neck supple.  Skin:    General: Skin is warm and dry.  Neurological:     Mental Status: She is alert and oriented to person, place, and time.  Psychiatric:        Mood and Affect: Mood normal.        Behavior: Behavior normal.    Today's Vitals   11/16/19 1531  BP: 108/60  Pulse: 98  SpO2: 99%  Weight: 133 lb (60.3 kg)  Height: 5\' 5"  (1.651 m)   Body mass index is 22.13 kg/m.      Assessment:     Participant in health and wellness plan       Plan:  1. Keep all regular appts with PCP 2. Encouraged healthy diet and exercise as tolerated. 3. F/u here prn

## 2021-03-06 ENCOUNTER — Ambulatory Visit: Payer: PRIVATE HEALTH INSURANCE | Admitting: Family

## 2021-03-06 ENCOUNTER — Encounter: Payer: Self-pay | Admitting: Family

## 2021-03-06 VITALS — BP 98/70 | HR 85 | Temp 97.8°F | Resp 16

## 2021-03-06 NOTE — Progress Notes (Signed)
Established Patient Office Visit  Subjective:  Patient ID: Debbie Clayton, female    DOB: 01/26/1961  Age: 60 y.o. MRN: [id number]  CC: No chief complaint on file.   HPI Annell Greening presents for wellness exam.   Past Medical History:  Diagnosis Date  . Arthritis   . Bursitis     History reviewed. No pertinent surgical history.  History reviewed. No pertinent family history.  Social History   Socioeconomic History  . Marital status: Single    Spouse name: Not on file  . Number of children: Not on file  . Years of education: Not on file  . Highest education level: Not on file  Occupational History  . Not on file  Tobacco Use  . Smoking status: Never Smoker  . Smokeless tobacco: Never Used  Substance and Sexual Activity  . Alcohol use: No  . Drug use: No  . Sexual activity: Not on file  Other Topics Concern  . Not on file  Social History Narrative  . Not on file   Social Determinants of Health   Financial Resource Strain: Not on file  Food Insecurity: Not on file  Transportation Needs: Not on file  Physical Activity: Not on file  Stress: Not on file  Social Connections: Not on file  Intimate Partner Violence: Not on file    Outpatient Medications Prior to Visit  Medication Sig Dispense Refill  . Cholecalciferol 50 MCG (2000 UT) CAPS TAKE 1 CAPSULE BY MOUTH EVERY DAY    . folic acid (FOLVITE) 1 MG tablet TAKE 1 TABLET BY MOUTH EVERY DAY    . methotrexate 50 MG/2ML injection SMARTSIG:0.6 Milliliter(s) Injection Once a Week    . Multiple Vitamin (DAILY-VITE) TABS TAKE 1 TABLET BY MOUTH EVERY DAY    . SUMAtriptan (IMITREX) 100 MG tablet Take 100 mg by mouth every 2 (two) hours as needed for migraine. May repeat in 2 hours if headache persists or recurs.     No facility-administered medications prior to visit.    Allergies  Allergen Reactions  . Aspirin Hives  . Tramadol Nausea And Vomiting  . Hydroxychloroquine Rash    ROS Review of Systems   Musculoskeletal: Positive for arthralgias and joint swelling.  All other systems reviewed and are negative.     Objective:    Physical Exam Constitutional:      Appearance: Normal appearance.  HENT:     Head: Normocephalic and atraumatic.     Mouth/Throat:     Mouth: Mucous membranes are dry.  Eyes:     Pupils: Pupils are equal, round, and reactive to light.  Cardiovascular:     Rate and Rhythm: Normal rate and regular rhythm.     Pulses: Normal pulses.     Heart sounds: Normal heart sounds.  Abdominal:     General: Abdomen is flat.  Musculoskeletal:        General: Normal range of motion.     Cervical back: Normal range of motion.  Skin:    General: Skin is warm and dry.  Neurological:     General: No focal deficit present.     Mental Status: She is alert.  Psychiatric:        Mood and Affect: Mood normal.        Behavior: Behavior normal.     There were no vitals taken for this visit. Wt Readings from Last 3 Encounters:  11/16/19 133 lb (60.3 kg)  01/17/17 145 lb (65.8 kg)  03/02/16 151 lb (68.5 kg)     Health Maintenance Due  Topic Date Due  . HIV Screening  Never done  . Hepatitis C Screening  Never done  . PAP SMEAR-Modifier  Never done  . COLONOSCOPY (Pts 45-42yr Insurance coverage will need to be confirmed)  Never done  . MAMMOGRAM  Never done  . Zoster Vaccines- Shingrix (1 of 2) Never done  . COVID-19 Vaccine (3 - Booster for Pfizer series) 11/27/2020  . TETANUS/TDAP  01/14/2021    There are no preventive care reminders to display for this patient.  No results found for: TSH No results found for: WBC, HGB, HCT, MCV, PLT No results found for: NA, K, CHLORIDE, CO2, GLUCOSE, BUN, CREATININE, BILITOT, ALKPHOS, AST, ALT, PROT, ALBUMIN, CALCIUM, ANIONGAP, EGFR, GFR No results found for: CHOL No results found for: HDL No results found for: LDLCALC No results found for: TRIG No results found for: CHOLHDL No results found for: HGBA1C     Assessment & Plan:   Problem List Items Addressed This Visit   None     No orders of the defined types were placed in this encounter.   Follow-up: No follow-ups on file.    AJerre Simon NP

## 2021-11-01 DIAGNOSIS — E782 Mixed hyperlipidemia: Secondary | ICD-10-CM | POA: Insufficient documentation

## 2021-11-01 DIAGNOSIS — M069 Rheumatoid arthritis, unspecified: Secondary | ICD-10-CM | POA: Insufficient documentation

## 2021-11-01 DIAGNOSIS — M2550 Pain in unspecified joint: Secondary | ICD-10-CM | POA: Insufficient documentation

## 2022-01-15 ENCOUNTER — Ambulatory Visit: Payer: Self-pay | Admitting: Nurse Practitioner

## 2022-01-15 ENCOUNTER — Encounter: Payer: Self-pay | Admitting: Nurse Practitioner

## 2022-01-15 VITALS — BP 108/66 | HR 83 | Temp 95.1°F | Resp 16 | Ht 65.0 in | Wt 130.0 lb

## 2022-01-15 DIAGNOSIS — M1990 Unspecified osteoarthritis, unspecified site: Secondary | ICD-10-CM

## 2022-01-15 DIAGNOSIS — H539 Unspecified visual disturbance: Secondary | ICD-10-CM | POA: Insufficient documentation

## 2022-01-15 DIAGNOSIS — Z789 Other specified health status: Secondary | ICD-10-CM

## 2022-01-15 DIAGNOSIS — G9332 Myalgic encephalomyelitis/chronic fatigue syndrome: Secondary | ICD-10-CM | POA: Insufficient documentation

## 2022-01-15 NOTE — Progress Notes (Signed)
? ?Established Patient Office Visit ? ?Subjective:  ?Patient ID: Debbie Clayton, female    DOB: 08/29/1961  Age: 61 y.o. MRN: [id number] ? ?CC: No chief complaint on file. ? ? ?HPI ?Debbie Clayton presents for annual wellness exam. She has no concerns for today. She is feeling well. Her PCP is Dr. Simona Huh and also has a rheumatologist. She was recently taken off methotrexate and started on new medication. Her annual labs including TSH, CBC w/ diff, CMP and lipid panel were all drawn in January 2023. ?Denies chest pain, SOB, dizziness, or headaches.  ? ?Past Medical History:  ?Diagnosis Date  ? Arthritis   ? Bursitis   ? ? ?No past surgical history on file. ? ?No family history on file. ? ?Social History  ? ?Socioeconomic History  ? Marital status: Single  ?  Spouse name: Not on file  ? Number of children: Not on file  ? Years of education: Not on file  ? Highest education level: Not on file  ?Occupational History  ? Not on file  ?Tobacco Use  ? Smoking status: Never  ? Smokeless tobacco: Never  ?Substance and Sexual Activity  ? Alcohol use: No  ? Drug use: No  ? Sexual activity: Not on file  ?Other Topics Concern  ? Not on file  ?Social History Narrative  ? Not on file  ? ?Social Determinants of Health  ? ?Financial Resource Strain: Not on file  ?Food Insecurity: Not on file  ?Transportation Needs: Not on file  ?Physical Activity: Not on file  ?Stress: Not on file  ?Social Connections: Not on file  ?Intimate Partner Violence: Not on file  ? ? ?Outpatient Medications Prior to Visit  ?Medication Sig Dispense Refill  ? Cholecalciferol 50 MCG (2000 UT) CAPS Take 1 capsule by mouth daily.    ? ENBREL SURECLICK 50 MG/ML injection Inject 4 mLs into the skin once a week.    ? folic acid (FOLVITE) 1 MG tablet TAKE 1 TABLET BY MOUTH EVERY DAY    ? methotrexate 50 MG/2ML injection SMARTSIG:0.6 Milliliter(s) Injection Once a Week    ? Multiple Vitamin (DAILY-VITE) TABS TAKE 1 TABLET BY MOUTH EVERY DAY    ? SUMAtriptan (IMITREX)  100 MG tablet Take 100 mg by mouth every 2 (two) hours as needed for migraine. May repeat in 2 hours if headache persists or recurs.    ? Cholecalciferol 50 MCG (2000 UT) CAPS TAKE 1 CAPSULE BY MOUTH EVERY DAY    ? ?No facility-administered medications prior to visit.  ? ? ?Allergies  ?Allergen Reactions  ? Aspirin Hives  ? Tramadol Nausea And Vomiting and Nausea Only  ? Hydroxychloroquine Rash and Other (See Comments)  ? ? ?ROS ?Review of Systems ? ? See HPI  ?Objective:  ?  ?Physical Exam ?Constitutional:   ?   General: She is not in acute distress. ?   Appearance: Normal appearance. She is normal weight. She is not ill-appearing.  ?HENT:  ?   Head: Normocephalic and atraumatic.  ?   Right Ear: Tympanic membrane normal.  ?   Left Ear: Tympanic membrane normal.  ?   Nose: Nose normal.  ?   Mouth/Throat:  ?   Mouth: Mucous membranes are moist.  ?   Pharynx: Oropharynx is clear.  ?Eyes:  ?   Extraocular Movements: Extraocular movements intact.  ?   Conjunctiva/sclera: Conjunctivae normal.  ?   Pupils: Pupils are equal, round, and reactive to light.  ?Cardiovascular:  ?  Rate and Rhythm: Normal rate and regular rhythm.  ?   Pulses: Normal pulses.  ?   Heart sounds: Normal heart sounds.  ?Pulmonary:  ?   Effort: Pulmonary effort is normal.  ?   Breath sounds: Normal breath sounds.  ?Abdominal:  ?   General: Abdomen is flat. Bowel sounds are normal.  ?   Palpations: Abdomen is soft.  ?Musculoskeletal:     ?   General: Normal range of motion.  ?   Cervical back: Normal range of motion and neck supple. No rigidity or tenderness.  ?Lymphadenopathy:  ?   Cervical: No cervical adenopathy.  ?Skin: ?   General: Skin is warm and dry.  ?   Capillary Refill: Capillary refill takes less than 2 seconds.  ?Neurological:  ?   General: No focal deficit present.  ?   Mental Status: She is alert and oriented to person, place, and time. Mental status is at baseline.  ?Psychiatric:     ?   Mood and Affect: Mood normal.     ?   Behavior:  Behavior normal.     ?   Thought Content: Thought content normal.     ?   Judgment: Judgment normal.  ? ? ?BP 108/66 (BP Location: Left Arm, Patient Position: Sitting, Cuff Size: Normal)   Pulse 83   Temp (!) 95.1 ?F (35.1 ?C) (Temporal)   Resp 16   Ht '5\' 5"'  (1.651 m)   Wt 130 lb (59 kg)   SpO2 98%   BMI 21.63 kg/m?  ?Wt Readings from Last 3 Encounters:  ?01/15/22 130 lb (59 kg)  ?11/16/19 133 lb (60.3 kg)  ?01/17/17 145 lb (65.8 kg)  ? ? ? ?Health Maintenance Due  ?Topic Date Due  ? HIV Screening  Never done  ? Hepatitis C Screening  Never done  ? Zoster Vaccines- Shingrix (1 of 2) Never done  ? PAP SMEAR-Modifier  Never done  ? COLONOSCOPY (Pts 45-76yr Insurance coverage will need to be confirmed)  Never done  ? MAMMOGRAM  Never done  ? TETANUS/TDAP  01/14/2021  ? COVID-19 Vaccine (4 - Booster for Pfizer series) 06/20/2021  ? ? ?There are no preventive care reminders to display for this patient. ? ?No results found for: TSH ?No results found for: WBC, HGB, HCT, MCV, PLT ?No results found for: NA, K, CHLORIDE, CO2, GLUCOSE, BUN, CREATININE, BILITOT, ALKPHOS, AST, ALT, PROT, ALBUMIN, CALCIUM, ANIONGAP, EGFR, GFR ?No results found for: CHOL ?No results found for: HDL ?No results found for: [place]?No results found for: TRIG ?No results found for: CHOLHDL ?No results found for: HGBA1C ? ?  ?Assessment & Plan:  ? ?Problem List Items Addressed This Visit   ? ?  ? Musculoskeletal and Integument  ? Inflammatory arthritis  ? Relevant Medications  ? ENBREL SURECLICK 50 MG/ML injection ? ?Ongoing. Controlled.  ?Continue regimen. Follow up with rheumatologist as needed/scheduled.   ? ?Other Visit Diagnoses   ? ? Participant in health and wellness plan    -  Primary ? ?Completed.  ?Annual labs completed and HM up to date.  ?  ? ?  ?RTC as needed. ? ?No orders of the defined types were placed in this encounter. ? ? ?Follow-up: No follow-ups on file.  ? ? ?MDrucilla Chalet NP ?

## 2022-06-27 ENCOUNTER — Encounter: Payer: Self-pay | Admitting: Nurse Practitioner

## 2022-06-27 ENCOUNTER — Ambulatory Visit: Payer: PRIVATE HEALTH INSURANCE | Admitting: Nurse Practitioner

## 2022-06-27 VITALS — BP 102/72 | HR 99

## 2022-06-27 DIAGNOSIS — Z789 Other specified health status: Secondary | ICD-10-CM

## 2022-06-27 NOTE — Progress Notes (Signed)
Office visit  Subjective:  Patient ID: Debbie Clayton, female    DOB: Feb 14, 1961  Age: 61 y.o. MRN: [id number]  CC: Wellness Exam   HPI [name] presents for wellness exam visit for insurance benefit.  Patient has a PCP: Tonny Bollman at [facility] PMH significant for: Arthritis follows rheumatologist.  Last labs per PCP were completed: Jan 2023  Health Maintenance:  Colonoscopy: reports she is due to repeat colonoscopy.  Mammogram: March 2023 DEXA: March 2023 PAP: 2023    Smoker: former  Immunizations:  Shingrix-  declines  COVID- x3   Lifestyle: Diet- reports she eats small portions Exercise- stays active at work.      Past Medical History:  Diagnosis Date   Arthritis    Bursitis     No past surgical history on file.  Outpatient Medications Prior to Visit  Medication Sig Dispense Refill   Cholecalciferol 50 MCG (2000 UT) CAPS Take 1 capsule by mouth daily.     folic acid (FOLVITE) 1 MG tablet TAKE 1 TABLET BY MOUTH EVERY DAY     leflunomide (ARAVA) 20 MG tablet Take 20 mg by mouth daily. Take one tablet by mouth every day for 30 days     Multiple Vitamin (DAILY-VITE) TABS TAKE 1 TABLET BY MOUTH EVERY DAY     SUMAtriptan (IMITREX) 100 MG tablet Take 100 mg by mouth every 2 (two) hours as needed for migraine. May repeat in 2 hours if headache persists or recurs.     Tofacitinib Citrate ER (XELJANZ XR) 11 MG TB24 1 tablet Orally Once a day     SUMAtriptan (IMITREX) 100 MG tablet Take 100 mg by mouth in the morning and at bedtime. Take one tablet by mouth twice a day as needed     ENBREL SURECLICK 50 MG/ML injection Inject 4 mLs into the skin once a week. (Patient not taking: Reported on 06/27/2022)     methotrexate 50 MG/2ML injection SMARTSIG:0.6 Milliliter(s) Injection Once a Week (Patient not taking: Reported on 06/27/2022)     No facility-administered medications prior to visit.    ROS Review of Systems  Respiratory:  Negative for  shortness of breath.   Cardiovascular:  Negative for chest pain.  Gastrointestinal:  Positive for constipation (due to calcium supplement.). Negative for diarrhea.  Musculoskeletal:  Positive for arthralgias. Negative for joint swelling.  Neurological:  Negative for headaches.    Objective:  BP 102/72   Pulse 99   SpO2 97%   Physical Exam Constitutional:      General: She is not in acute distress. HENT:     Head: Normocephalic.  Cardiovascular:     Rate and Rhythm: Normal rate and regular rhythm.     Heart sounds: Normal heart sounds.  Pulmonary:     Effort: Pulmonary effort is normal.     Breath sounds: Normal breath sounds.  Musculoskeletal:        General: No swelling or deformity. Normal range of motion.     Right lower leg: No edema.     Left lower leg: No edema.  Skin:    General: Skin is warm.  Neurological:     General: No focal deficit present.     Mental Status: She is alert and oriented to person, place, and time.  Psychiatric:        Mood and Affect: Mood normal.        Behavior: Behavior normal.      Assessment & Plan:  Hillarie was seen today for wellness exam.  Diagnoses and all orders for this visit:  Participant in health and wellness plan Adult wellness physical was conducted today. Importance of diet and exercise were discussed in detail.  We reviewed immunizations and gave recommendations regarding current immunization needed for age.  Preventative health exams are up to date.   Patient was advised yearly wellness exam and follow-up with PCP as scheduled.     No orders of the defined types were placed in this encounter.   No orders of the defined types were placed in this encounter.   Follow-up: as needed

## 2023-05-06 ENCOUNTER — Encounter: Payer: Self-pay | Admitting: Family

## 2023-05-06 ENCOUNTER — Ambulatory Visit: Payer: PRIVATE HEALTH INSURANCE | Admitting: Family

## 2023-05-06 VITALS — BP 118/72 | HR 83 | Temp 98.4°F | Resp 16 | Ht 65.0 in | Wt 132.0 lb

## 2023-05-06 DIAGNOSIS — Z789 Other specified health status: Secondary | ICD-10-CM

## 2023-05-06 NOTE — Progress Notes (Signed)
Office visit  Subjective:  Patient ID: Debbie Clayton, female    DOB: 1961/08/13  Age: 62 y.o. MRN: [id number]  CC: Wellness Exam   HPI Debbie Clayton presents for wellness exam visit for insurance benefit.  Patient has a PCP: Debbie Clayton at [facility] PMH significant for: Arthritis follows rheumatologist.  Last labs per PCP were completed: Jan 2023  Health Maintenance:  Colonoscopy: reports she is due to repeat colonoscopy.  Mammogram: March 2023 DEXA: March 2023 PAP: 2023    Smoker: former  Immunizations:  Shingrix-  declines  COVID- x3   Lifestyle: Diet- reports she eats small portions Exercise- stays active at work.      Past Medical History:  Diagnosis Date   Arthritis    Bursitis     No past surgical history on file.  Outpatient Medications Prior to Visit  Medication Sig Dispense Refill   Cholecalciferol 50 MCG (2000 UT) CAPS Take 1 capsule by mouth daily.     folic acid (FOLVITE) 1 MG tablet TAKE 1 TABLET BY MOUTH EVERY DAY     leflunomide (ARAVA) 20 MG tablet Take 20 mg by mouth daily. Take one tablet by mouth every day for 30 days     Multiple Vitamin (DAILY-VITE) TABS TAKE 1 TABLET BY MOUTH EVERY DAY     SUMAtriptan (IMITREX) 100 MG tablet Take 100 mg by mouth every 2 (two) hours as needed for migraine. May repeat in 2 hours if headache persists or recurs.     Tofacitinib Citrate ER (XELJANZ XR) 11 MG TB24 1 tablet Orally Once a day     SUMAtriptan (IMITREX) 100 MG tablet Take 100 mg by mouth in the morning and at bedtime. Take one tablet by mouth twice a day as needed     ENBREL SURECLICK 50 MG/ML injection Inject 4 mLs into the skin once a week. (Patient not taking: Reported on 06/27/2022)     methotrexate 50 MG/2ML injection SMARTSIG:0.6 Milliliter(s) Injection Once a Week (Patient not taking: Reported on 06/27/2022)     No facility-administered medications prior to visit.    ROS Review of Systems  Respiratory:  Negative for  shortness of breath.   Cardiovascular:  Negative for chest pain.  Gastrointestinal:  Positive for constipation (due to calcium supplement.). Negative for diarrhea.  Musculoskeletal:  Positive for arthralgias. Negative for joint swelling.  Neurological:  Negative for headaches.    Objective:  BP 102/72   Pulse 99   SpO2 97%   Physical Exam Constitutional:      General: She is not in acute distress. HENT:     Head: Normocephalic.  Cardiovascular:     Rate and Rhythm: Normal rate and regular rhythm.     Heart sounds: Normal heart sounds.  Pulmonary:     Effort: Pulmonary effort is normal.     Breath sounds: Normal breath sounds.  Musculoskeletal:        General: No swelling or deformity. Normal range of motion.     Right lower leg: No edema.     Left lower leg: No edema.  Skin:    General: Skin is warm.  Neurological:     General: No focal deficit present.     Mental Status: She is alert and oriented to person, place, and time.  Psychiatric:        Mood and Affect: Mood normal.        Behavior: Behavior normal.      Assessment & Plan:  Debbie Clayton was seen today for wellness exam.  Diagnoses and all orders for this visit:  Participant in health and wellness plan Adult wellness physical was conducted today. Importance of diet and exercise were discussed in detail.  Preventative health exams are up to date.   Patient was advised to continue regular visits with PCP and rheumatology.       Follow-up: RTC as needed

## 2023-06-19 ENCOUNTER — Ambulatory Visit: Payer: PRIVATE HEALTH INSURANCE | Admitting: Nurse Practitioner

## 2023-06-19 VITALS — BP 138/92 | HR 90 | Temp 97.5°F | Resp 18

## 2023-06-19 DIAGNOSIS — M069 Rheumatoid arthritis, unspecified: Secondary | ICD-10-CM

## 2023-06-19 NOTE — Progress Notes (Signed)
Occupational [facility]- [facility]  Subjective:  Patient ID: Debbie Clayton, female    DOB: 1960-10-26  Age: 62 y.o. MRN: [id number]  CC: Rhematoid Arthritis Flare   HPI Debbie Clayton presents for rheumatoid arthritis flare x 2 days. Patient reports experiencing 10/10 dull pain to left arm  radiating from shoulder to elbow, denies swelling, redness, or chest discomfort.States pain is consistent with previous RA flares. Reports using topical Icy Hot and Ibuprofen 800 mg for partial, short-term relief. Patient is currently managed by Rheumatology and is compliant with regimen of Xelganz XR and Arava.   Past Medical History:  Diagnosis Date   Arthritis    Bursitis     No past surgical history on file.  Outpatient Medications Prior to Visit  Medication Sig Dispense Refill   Cholecalciferol 50 MCG (2000 UT) CAPS Take 1 capsule by mouth daily.     folic acid (FOLVITE) 1 MG tablet TAKE 1 TABLET BY MOUTH EVERY DAY     leflunomide (ARAVA) 20 MG tablet Take 20 mg by mouth daily. Take one tablet by mouth every day for 30 days     Multiple Vitamin (DAILY-VITE) TABS TAKE 1 TABLET BY MOUTH EVERY DAY     SUMAtriptan (IMITREX) 100 MG tablet Take 100 mg by mouth every 2 (two) hours as needed for migraine. May repeat in 2 hours if headache persists or recurs.     Tofacitinib Citrate ER (XELJANZ XR) 11 MG TB24 1 tablet Orally Once a day     SUMAtriptan (IMITREX) 100 MG tablet Take 100 mg by mouth in the morning and at bedtime. Take one tablet by mouth twice a day as needed (Patient not taking: Reported on 06/19/2023)     No facility-administered medications prior to visit.    ROS Review of Systems  Constitutional:  Negative for fatigue.  Cardiovascular: Negative.   Musculoskeletal:  Positive for arthralgias. Negative for joint swelling.       Dull pain to left arm, radiating from shoulder to elbow  Skin: Negative.   Neurological: Negative.     Objective:  Pulse 90   Temp (!) 97.5 F (36.4 C)  (Temporal)   Resp 18   SpO2 98%   Physical Exam Constitutional:      General: She is not in acute distress. Cardiovascular:     Rate and Rhythm: Normal rate and regular rhythm.  Pulmonary:     Effort: Pulmonary effort is normal.     Breath sounds: Normal breath sounds.  Musculoskeletal:        General: No swelling or tenderness.  Skin:    General: Skin is warm and dry.  Neurological:     General: No focal deficit present.      Assessment & Plan:    Daleyza was seen today for rhematoid arthritis flare.  Diagnoses and all orders for this visit:  Rheumatoid arthritis flare [facility])  Patient previously established with specialist. Referred to Rheumatology, Dr.Michelle Maple Hudson, MD for acute flare management.   Follow-up: Follow up to clinic as needed.

## 2024-06-01 ENCOUNTER — Ambulatory Visit: Payer: PRIVATE HEALTH INSURANCE | Admitting: Nurse Practitioner

## 2024-06-08 ENCOUNTER — Encounter: Payer: Self-pay | Admitting: Nurse Practitioner

## 2024-06-08 ENCOUNTER — Ambulatory Visit: Payer: PRIVATE HEALTH INSURANCE | Admitting: Nurse Practitioner

## 2024-06-08 VITALS — BP 106/60 | HR 71 | Temp 96.6°F

## 2024-06-08 DIAGNOSIS — Z789 Other specified health status: Secondary | ICD-10-CM

## 2024-06-08 NOTE — Progress Notes (Signed)
 Occupational Health- [facility]  Subjective:  Patient ID: Debbie Clayton, female    DOB: 10-Aug-1961  Age: 63 y.o. MRN: [id number]  CC: wellness exam   HPI Debbie Clayton presents for wellness exam visit for insurance benefit.  Patient has a PCP: Daron Mace PM[facility] significant for: HLD, RA and OSA Last labs per PCP were completed: April 2025  Health Maintenance:  Colonoscopy: July 2025, repeat in 10 years. Mammogram: May 2025 Pap: 2025    Smoker: never  Immunizations:  Shingrix- never Flu- yearly   Lifestyle: Diet- low appetite.  Exercise- very active at work.     Past Medical History:  Diagnosis Date   Arthritis    Bursitis     No past surgical history on file.  Outpatient Medications Prior to Visit  Medication Sig Dispense Refill   cetirizine (ZYRTEC ALLERGY) 10 MG tablet Take 10 mg by mouth daily.     Cholecalciferol 50 MCG (2000 UT) CAPS Take 1 capsule by mouth daily.     diclofenac (VOLTAREN) 75 MG EC tablet Take 75 mg by mouth 2 (two) times daily.     folic acid (FOLVITE) 1 MG tablet TAKE 1 TABLET BY MOUTH EVERY DAY     leflunomide (ARAVA) 20 MG tablet Take 20 mg by mouth daily. Take one tablet by mouth every day for 30 days     Multiple Vitamin (DAILY-VITE) TABS TAKE 1 TABLET BY MOUTH EVERY DAY     SUMAtriptan (IMITREX) 100 MG tablet Take 100 mg by mouth every 2 (two) hours as needed for migraine. May repeat in 2 hours if headache persists or recurs.     Upadacitinib ER (RINVOQ) 15 MG TB24 1 tablet Orally Once a day; Duration: 30 days     SUMAtriptan (IMITREX) 100 MG tablet Take 100 mg by mouth in the morning and at bedtime. Take one tablet by mouth twice a day as needed (Patient not taking: Reported on 06/19/2023)     Tofacitinib Citrate ER (XELJANZ XR) 11 MG TB24 1 tablet Orally Once a day (Patient not taking: Reported on 06/08/2024)     No facility-administered medications prior to visit.    ROS Review of Systems  Constitutional:  Negative for activity  change.  HENT:  Negative for hearing loss.   Eyes:  Negative for visual disturbance.  Respiratory:  Negative for shortness of breath.   Cardiovascular:  Negative for chest pain and leg swelling.  Gastrointestinal:  Negative for constipation, diarrhea and nausea.  Musculoskeletal:  Positive for arthralgias and joint swelling. Negative for back pain.  Neurological:  Negative for dizziness and headaches.  Psychiatric/Behavioral:  Negative for sleep disturbance.     Objective:  BP 106/60 (BP Location: Left Arm, Patient Position: Sitting, Cuff Size: Normal)   Pulse 71   Temp (!) 96.6 F (35.9 C) (Temporal)   SpO2 98%   Physical Exam Constitutional:      General: She is not in acute distress. HENT:     Head: Normocephalic.     Right Ear: Tympanic membrane, ear canal and external ear normal.     Left Ear: Tympanic membrane, ear canal and external ear normal.     Nose: No congestion.     Mouth/Throat:     Mouth: Mucous membranes are moist.     Pharynx: No oropharyngeal exudate.  Eyes:     Pupils: Pupils are equal, round, and reactive to light.  Cardiovascular:     Rate and Rhythm: Normal rate and regular rhythm.  Heart sounds: Normal heart sounds.  Pulmonary:     Effort: Pulmonary effort is normal.     Breath sounds: Normal breath sounds.  Abdominal:     General: Abdomen is flat. Bowel sounds are normal.     Palpations: Abdomen is soft.  Musculoskeletal:        General: Normal range of motion.  Skin:    General: Skin is warm.  Neurological:     General: No focal deficit present.     Mental Status: She is alert and oriented to person, place, and time.  Psychiatric:        Mood and Affect: Mood normal.        Behavior: Behavior normal.      Assessment & Plan:    [name] was seen today for wellness exam.  Diagnoses and all orders for this visit:  Participant in health and wellness plan Adult wellness physical was conducted today. Importance of diet and exercise  were discussed in detail.  We reviewed immunizations and gave recommendations regarding current immunization needed for age.  Preventative health exams are up to date.  Needs Shingles vaccine. She is interested, she will get back to me about scheduling this.   Patient was advised yearly wellness exam and follow-up with PCP as scheduled.     No orders of the defined types were placed in this encounter.   No orders of the defined types were placed in this encounter.   Follow-up: as needed.

## 2024-11-16 ENCOUNTER — Ambulatory Visit: Payer: PRIVATE HEALTH INSURANCE | Admitting: Nurse Practitioner
